# Patient Record
Sex: Female | Born: 1970 | Race: Asian | Hispanic: No | Marital: Married | Smoking: Never smoker
Health system: Southern US, Community
[De-identification: ages and names within clinical notes are randomized; demographics above are authoritative.]

## PROBLEM LIST (undated history)

## (undated) DIAGNOSIS — M1990 Unspecified osteoarthritis, unspecified site: Secondary | ICD-10-CM

## (undated) HISTORY — PX: CYSTECTOMY: SUR359

## (undated) HISTORY — PX: DENTAL SURGERY: SHX609

## (undated) HISTORY — PX: WISDOM TOOTH EXTRACTION: SHX21

## (undated) HISTORY — PX: OOPHORECTOMY: SHX86

## (undated) HISTORY — DX: Unspecified osteoarthritis, unspecified site: M19.90

---

## 1999-11-06 ENCOUNTER — Encounter: Admission: RE | Admit: 1999-11-06 | Discharge: 1999-11-06 | Payer: Self-pay | Admitting: Obstetrics

## 2003-11-14 ENCOUNTER — Emergency Department (HOSPITAL_COMMUNITY): Admission: EM | Admit: 2003-11-14 | Discharge: 2003-11-14 | Payer: Self-pay | Admitting: Emergency Medicine

## 2004-08-17 HISTORY — PX: OOPHORECTOMY: SHX86

## 2010-03-20 ENCOUNTER — Encounter (INDEPENDENT_AMBULATORY_CARE_PROVIDER_SITE_OTHER): Payer: Self-pay | Admitting: *Deleted

## 2010-05-01 ENCOUNTER — Ambulatory Visit: Payer: Self-pay | Admitting: Gastroenterology

## 2010-05-01 ENCOUNTER — Encounter (INDEPENDENT_AMBULATORY_CARE_PROVIDER_SITE_OTHER): Payer: Self-pay | Admitting: *Deleted

## 2010-05-01 DIAGNOSIS — K5900 Constipation, unspecified: Secondary | ICD-10-CM | POA: Insufficient documentation

## 2010-05-01 DIAGNOSIS — K644 Residual hemorrhoidal skin tags: Secondary | ICD-10-CM | POA: Insufficient documentation

## 2010-05-01 LAB — CONVERTED CEMR LAB
ALT: 21 units/L (ref 0–35)
AST: 19 units/L (ref 0–37)
Albumin: 4.4 g/dL (ref 3.5–5.2)
Alkaline Phosphatase: 52 units/L (ref 39–117)
Basophils Absolute: 0 10*3/uL (ref 0.0–0.1)
Bilirubin, Direct: 0.1 mg/dL (ref 0.0–0.3)
CO2: 30 meq/L (ref 19–32)
Chloride: 102 meq/L (ref 96–112)
Eosinophils Relative: 0.2 % (ref 0.0–5.0)
Glucose, Bld: 89 mg/dL (ref 70–99)
HCT: 41.6 % (ref 36.0–46.0)
Lymphocytes Relative: 16.8 % (ref 12.0–46.0)
Lymphs Abs: 1.1 10*3/uL (ref 0.7–4.0)
Monocytes Relative: 4.6 % (ref 3.0–12.0)
Neutrophils Relative %: 77.9 % — ABNORMAL HIGH (ref 43.0–77.0)
Platelets: 171 10*3/uL (ref 150.0–400.0)
Potassium: 3.6 meq/L (ref 3.5–5.1)
Saturation Ratios: 31.3 % (ref 20.0–50.0)
Sed Rate: 5 mm/hr (ref 0–22)
Sodium: 138 meq/L (ref 135–145)
Tissue Transglutaminase Ab, IgA: 6.9 units (ref <20)
Total Protein: 7.1 g/dL (ref 6.0–8.3)
Transferrin: 233.1 mg/dL (ref 212.0–360.0)
WBC: 6.7 10*3/uL (ref 4.5–10.5)

## 2010-05-14 ENCOUNTER — Ambulatory Visit: Payer: Self-pay | Admitting: Gastroenterology

## 2010-09-18 NOTE — Procedures (Signed)
Summary: Colonoscopy  Patient: Stephanie Murphy Note: All result statuses are Final unless otherwise noted.  Tests: (1) Colonoscopy (COL)   COL Colonoscopy           DONE     [facility]     [address]     [place], [place]  [address]           COLONOSCOPY PROCEDURE REPORT           PATIENT:  Stephanie Murphy, Stephanie Murphy  MR#:  [id number]     BIRTHDATE:  08/22/1970, 39 yrs. old  GENDER:  female     ENDOSCOPIST:  Vania Rea. Jarold Motto, MD, [facility]     REF. BY:     PROCEDURE DATE:  05/14/2010     PROCEDURE:  Average-risk screening colonoscopy     G0121     ASA CLASS:  Class I     INDICATIONS:  constipation WEIGHT LOSS.     MEDICATIONS:   Fentanyl 50 mcg IV, Versed 4 mg IV           DESCRIPTION OF PROCEDURE:   After the risks benefits and     alternatives of the procedure were thoroughly explained, informed     consent was obtained.  Digital rectal exam was performed and     revealed no abnormalities.   The LB [id number] endoscope     was introduced through the anus and advanced to the cecum, which     was identified by both the appendix and ileocecal valve, without     limitations.  The quality of the prep was excellent, using     Nulytley.  The instrument was then slowly withdrawn as the colon     was fully examined.     <<PROCEDUREIMAGES>>           FINDINGS:  No polyps or cancers were seen.  This was otherwise a     normal examination of the colon.  External hemorrhoids were found.     Retroflexed views in the rectum revealed no abnormalities.    The     scope was then withdrawn from the patient and the procedure     completed.           COMPLICATIONS:  None     ENDOSCOPIC IMPRESSION:     1) No polyps or cancers     2) Otherwise normal examination     3) External hemorrhoids     CONSTIPATION PREDOMINANT IBS.     RECOMMENDATIONS:     1) High fiber diet with liberal fluid intake.     2) Titrate to need     REPEAT EXAM:  No           ______________________________     Vania Rea.  Jarold Motto, MD, [facility]           CC:           n.     eSIGNED:   Vania Rea. Jahmil Macleod at 05/14/2010 03:59 PM           Shon Hough, [phone]  Note: An exclamation mark (!) indicates a result that was not dispersed into the flowsheet. Document Creation Date: 05/14/2010 4:03 PM _______________________________________________________________________  (1) Order result status: Final Collection or observation date-time: 05/14/2010 15:55 Requested date-time:  Receipt date-time:  Reported date-time:  Referring Physician:   Ordering Physician: Sheryn Bison [phone]) Specimen Source:  Source: Launa Grill Order Number: [id number] Lab site:

## 2010-09-18 NOTE — Letter (Signed)
Summary: [facility] Instructions  [facility]  [address] [place], [place] [address]   Phone: [phone]  Fax: [phone]       CALYNN FERRERO    April 28, 1971    MRN: [id number]        Procedure Day Dorna Bloom: Wednesday 05/14/2010     Arrival Time: 3:00pm     Procedure Time: 4:00pm       Location of Procedure:                    X   [facility]oscopy Center (4th Floor)                       PREPARATION FOR COLONOSCOPY WITH MOVIPREP   Starting 5 days prior to your procedure 05/09/2010 do not eat nuts, seeds, popcorn, corn, beans, peas,  salads, or any raw vegetables.  Do not take any fiber supplements (e.g. Metamucil, Citrucel, and Benefiber).  THE DAY BEFORE YOUR PROCEDURE         DATE: 05/13/2010  DAY: Tuesday  1.  Drink clear liquids the entire day-NO SOLID FOOD  2.  Do not drink anything colored red or purple.  Avoid juices with pulp.  No orange juice.  3.  Drink at least 64 oz. (8 glasses) of fluid/clear liquids during the day to prevent dehydration and help the prep work efficiently.  CLEAR LIQUIDS INCLUDE: Water Jello Ice Popsicles Tea (sugar ok, no milk/cream) Powdered fruit flavored drinks Coffee (sugar ok, no milk/cream) Gatorade Juice: apple, white grape, white cranberry  Lemonade Clear bullion, consomm, broth Carbonated beverages (any kind) Strained chicken noodle soup Hard Candy                             4.  In the morning, mix first dose of MoviPrep solution:    Empty 1 Pouch A and 1 Pouch B into the disposable container    Add lukewarm drinking water to the top line of the container. Mix to dissolve    Refrigerate (mixed solution should be used within 24 hrs)  5.  Begin drinking the prep at 5:00 p.m. The MoviPrep container is divided by 4 marks.   Every 15 minutes drink the solution down to the next mark (approximately 8 oz) until the full liter is complete.   6.  Follow completed prep with 16 oz of clear liquid of your choice (Nothing red  or purple).  Continue to drink clear liquids until bedtime.  7.  Before going to bed, mix second dose of MoviPrep solution:    Empty 1 Pouch A and 1 Pouch B into the disposable container    Add lukewarm drinking water to the top line of the container. Mix to dissolve    Refrigerate  THE DAY OF YOUR PROCEDURE      DATE: 05/13/2010 DAY: Wednesday  Beginning at  11:00am (5 hours before procedure):         1. Every 15 minutes, drink the solution down to the next mark (approx 8 oz) until the full liter is complete.  2. Follow completed prep with 16 oz. of clear liquid of your choice.    3. You may drink clear liquids until 2:00pm (2 HOURS BEFORE PROCEDURE).   MEDICATION INSTRUCTIONS  Unless otherwise instructed, you should take regular prescription medications with a small sip of water   as early as possible the morning of your procedure.  OTHER INSTRUCTIONS  You will need a responsible adult at least 41 years of age to accompany you and drive you home.   This person must remain in the waiting room during your procedure.  Wear loose fitting clothing that is easily removed.  Leave jewelry and other valuables at home.  However, you may wish to bring a book to read or  an iPod/MP3 player to listen to music as you wait for your procedure to start.  Remove all body piercing jewelry and leave at home.  Total time from sign-in until discharge is approximately 2-3 hours.  You should go home directly after your procedure and rest.  You can resume normal activities the  day after your procedure.  The day of your procedure you should not:   Drive   Make legal decisions   Operate machinery   Drink alcohol   Return to work  You will receive specific instructions about eating, activities and medications before you leave.    The above instructions have been reviewed and explained to me by   _______________________    I fully understand and can verbalize these  instructions _____________________________ Date _________

## 2010-09-18 NOTE — Letter (Signed)
Summary: New Patient letter  [facility]  [address] [place], [place] [address]   Phone: [phone]  Fax: [phone]       03/20/2010 MRN: [id number]  Stephanie Murphy [address] [place], [place]  [address]  Dear Stephanie Murphy,  Welcome to the Gastroenterology Division at [facility].    You are scheduled to see Dr.  Jarold Motto on 05-01-10 at 9:00a.m. on the 3rd floor at [facility], [address]  We ask that you try to arrive at our office 15 minutes prior to your appointment time to allow for check-in.  We would like you to complete the enclosed self-administered evaluation form prior to your visit and bring it with you on the day of your appointment.  We will review it with you.  Also, please bring a complete list of all your medications or, if you prefer, bring the medication bottles and we will list them.  Please bring your insurance card so that we may make a copy of it.  If your insurance requires a referral to see a specialist, please bring your referral form from your primary care physician.  Co-payments are due at the time of your visit and may be paid by cash, check or credit card.     Your office visit will consist of a consult with your physician (includes a physical exam), any laboratory testing he/she may order, scheduling of any necessary diagnostic testing (e.g. x-ray, ultrasound, CT-scan), and scheduling of a procedure (e.g. Endoscopy, Colonoscopy) if required.  Please allow enough time on your schedule to allow for any/all of these possibilities.    If you cannot keep your appointment, please call [phone] to cancel or reschedule prior to your appointment date.  This allows [place] the opportunity to schedule an appointment for another patient in need of care.  If you do not cancel or reschedule by 5 p.m. the business day prior to your appointment date, you will be charged a $50.00 late cancellation/no-show fee.    Thank you for choosing  [facility] for your medical needs.  We appreciate the opportunity to care for you.  Please visit [place] at our website  to learn more about our practice.                     Sincerely,                                                             The Gastroenterology Division

## 2010-09-18 NOTE — Assessment & Plan Note (Signed)
Summary: diarrhea--ch.   History of Present Illness Visit Type: Initial Visit Primary GI MD: Sheryn Bison MD FACP FAGA Primary Provider: n/a Chief Complaint: Constipation, patient has urgency multiples times daily, but nothing come out and has a lot of pain; when she does have a bowel movement once daily it is only pellets. History of Present Illness:   40 year old Asian female accompanied by her husband. The patient herself does not speak or understand [place], and her husband has limited ability in speaking [place].  The patient complains of obstipation with gas, bloating, hard bowel movements, tenesmus, and periodic rectal pain over the last year. Apparently she eats poorly has had a progressive weight loss. She denies upper gastrointestinal or hepatobiliary complaints but has had no significant medical evaluation. There is no history of alcohol or cigarette abuse or NSAID abuse.  She specifically denies acid reflux or dysphagia or any history of liver disease or pancreatic problems. Family history is noncontributory. She denies systemic complaints.   GI Review of Systems    Reports bloating and  loss of appetite.      Denies abdominal pain, acid reflux, belching, chest pain, dysphagia with liquids, dysphagia with solids, heartburn, nausea, vomiting, vomiting blood, weight loss, and  weight gain.      Reports rectal pain.     Denies anal fissure, black tarry stools, change in bowel habit, constipation, diarrhea, diverticulosis, fecal incontinence, heme positive stool, hemorrhoids, irritable bowel syndrome, jaundice, light color stool, liver problems, and  rectal bleeding. Preventive Screening-Counseling & Management  Alcohol-Tobacco     Smoking Status: never      Drug Use:  no.      Current Medications (verified): 1)  None  Allergies (verified): No Known Drug Allergies  Past History:  Past medical, surgical, family and social histories (including risk factors) reviewed  for relevance to current acute and chronic problems.  Family History: Reviewed history and no changes required.  Social History: Reviewed history and no changes required. 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  Alcohol Use - no Daily Caffeine Use 1 Illicit Drug Use - no Smoking Status:  never Drug Use:  no  Review of Systems       The patient complains of depression-new, urination - excessive, and voice change.  The patient denies allergy/sinus, anemia, anxiety-new, arthritis/joint pain, back pain, blood in urine, breast changes/lumps, change in vision, confusion, cough, coughing up blood, fainting, fatigue, fever, headaches-new, hearing problems, heart murmur, heart rhythm changes, itching, menstrual pain, muscle pains/cramps, night sweats, nosebleeds, pregnancy symptoms, shortness of breath, skin rash, sleeping problems, sore throat, swelling of feet/legs, swollen lymph glands, thirst - excessive , urination - excessive , urination changes/pain, urine leakage, and vision changes.    Vital Signs:  Patient profile:   40 year old female Height:      62 inches Weight:      105 pounds BMI:     19.27 Pulse rate:   112 / minute Pulse rhythm:   regular BP sitting:   108 / 78  (left arm) Cuff size:   regular  Vitals Entered By: June McMurray CMA Duncan Dull) (May 01, 2010 9:24 AM)  Physical Exam  General:  Well developed, well nourished, no acute distress.healthy appearing.  Somewhat thin appearing but in no acute distress. Head:  Normocephalic and atraumatic. Eyes:  PERRLA, no icterus.exam deferred to patient's ophthalmologist.   Neck:  Supple; no masses or thyromegaly. Lungs:  Clear throughout to auscultation. Heart:  Regular rate and rhythm; no  murmurs, rubs,  or bruits. Abdomen:  Soft, nontender and nondistended. No masses, hepatosplenomegaly or hernias noted. Normal bowel sounds. Rectal:  Normal exam.hemocult negative.  Anoscopic Exam unremarkable except for  some inflamed external hemorrhoidal tissue. I cannot appreciate any definite fissure or fistula. Extremities:  No clubbing, cyanosis, edema or deformities noted. Neurologic:  Alert and  oriented x4;  grossly normal neurologically. Cervical Nodes:  No significant cervical adenopathy. Psych:  Alert and cooperative. Normal mood and affect.   Impression & Recommendations:  Problem # 1:  CONSTIPATION (ICD-564.00) Assessment Deteriorated basic labs ordered along with screening colonoscopy. I placed her on Colace 100 mg twice a day with q.h.s. MiraLax and liberal p.o. fluids. Orders: TLB-CBC Platelet - w/Differential (85025-CBCD) TLB-BMP (Basic Metabolic Panel-BMET) (80048-METABOL) TLB-Hepatic/Liver Function Pnl (80076-HEPATIC) TLB-TSH (Thyroid Stimulating Hormone) (84443-TSH) TLB-B12, Serum-Total ONLY (98119-J47) TLB-Ferritin (82728-FER) TLB-Folic Acid (Folate) (82746-FOL) TLB-IBC Pnl (Iron/FE;Transferrin) (83550-IBC) TLB-Sedimentation Rate (ESR) (85652-ESR) T-Sprue Panel (Celiac Disease Aby Eval) (83516x3/[address]) TLB-IgA (Immunoglobulin A) (82784-IGA) Colonoscopy (Colon)  Problem # 2:  EXTERNAL HEMORRHOIDS (ICD-455.3) Assessment: Unchanged Canasa 1 g suppositories at bedtime with p.r.n. Analpram cream locally. I suspect she does have a small occult fissure in addition to her external hemorrhoids. Orders: TLB-CBC Platelet - w/Differential (85025-CBCD) TLB-BMP (Basic Metabolic Panel-BMET) (80048-METABOL) TLB-Hepatic/Liver Function Pnl (80076-HEPATIC) TLB-TSH (Thyroid Stimulating Hormone) (84443-TSH) TLB-B12, Serum-Total ONLY (82956-O13) TLB-Ferritin (82728-FER) TLB-Folic Acid (Folate) (82746-FOL) TLB-IBC Pnl (Iron/FE;Transferrin) (83550-IBC) TLB-Sedimentation Rate (ESR) (85652-ESR) T-Sprue Panel (Celiac Disease Aby Eval) (83516x3/[address]) TLB-IgA (Immunoglobulin A) (82784-IGA) Colonoscopy (Colon)  Patient Instructions: 1)  [facility] Patient Information Guide  given to patient.  2)  Please go to the basement today for your labs.  3)  Colonoscopy and Flexible Sigmoidoscopy brochure given.  4)  Conscious Sedation brochure given.  5)  Your prescriptions have been sent to you pharmacy, all directions are listed on your med list and the rx.  6)  Your procedure has been scheduled for 05/14/2010, please follow the seperate instructions.  7)  The medication list was reviewed and reconciled.  All changed / newly prescribed medications were explained.  A complete medication list was provided to the patient / caregiver. Prescriptions: MOVIPREP 100 GM  SOLR (PEG-KCL-NACL-NASULF-NA ASC-C) As per prep instructions.  #1 x 0   Entered by:   Harlow Mares CMA (AAMA)   Authorized by:   Mardella Layman MD [facility]   Signed by:   Harlow Mares CMA (AAMA) on 05/01/2010   Method used:   Electronically to        [facility] [address] (retail)       [address]       [place], [place]  [address]       Ph: [phone]       Fax: [phone]   RxID:   [id number] CANASA 1000 MG SUPP (MESALAMINE) remove foil and insert one supp into rectum at bedtime  #20 x 0   Entered by:   Harlow Mares CMA (AAMA)   Authorized by:   Mardella Layman MD [facility]   Signed by:   Harlow Mares CMA (AAMA) on 05/01/2010   Method used:   Electronically to        [facility] (retail)       [address]       [place], [place]  [address]       Ph: [phone]       Fax: [phone]   RxID:   [id number] MIRALAX  POWD (POLYETHYLENE GLYCOL  3350) mix one capfull in 8 ounce of water and drink at bedtime  #527 x 2   Entered by:   Harlow Mares CMA (AAMA)   Authorized by:   Mardella Layman MD [facility]   Signed by:   Harlow Mares CMA (AAMA) on 05/01/2010   Method used:   Electronically to        [facility] (retail)       [address]       [place], [place]  [address]       Ph: [phone]       Fax: [phone]    RxID:   [id number] COLACE 100 MG CAPS (DOCUSATE SODIUM) take one by mouth two times a day  #60 x 2   Entered by:   Harlow Mares CMA (AAMA)   Authorized by:   Mardella Layman MD [facility]   Signed by:   Harlow Mares CMA (AAMA) on 05/01/2010   Method used:   Electronically to        [facility] (retail)       [address]       [place], [place]  [id number]       Ph: [phone]       Fax: [phone]   RxID:   [id number] ANALPRAM-HC 1-2.5 % CREA (HYDROCORTISONE ACE-PRAMOXINE) use per retum two times a day  #1 tube x 1   Entered by:   Harlow Mares CMA (AAMA)   Authorized by:   Mardella Layman MD [facility]   Signed by:   Harlow Mares CMA (AAMA) on 05/01/2010   Method used:   Electronically to        [facility] (retail)       [address]       [place], [place]  [id number]       Ph: [phone]       Fax: [phone]   RxID:   [id number]

## 2013-09-14 ENCOUNTER — Other Ambulatory Visit: Payer: Self-pay | Admitting: Family Medicine

## 2013-09-14 DIAGNOSIS — B1910 Unspecified viral hepatitis B without hepatic coma: Secondary | ICD-10-CM

## 2013-09-14 DIAGNOSIS — A0472 Enterocolitis due to Clostridium difficile, not specified as recurrent: Secondary | ICD-10-CM

## 2013-09-14 DIAGNOSIS — R140 Abdominal distension (gaseous): Secondary | ICD-10-CM

## 2013-09-21 ENCOUNTER — Ambulatory Visit
Admission: RE | Admit: 2013-09-21 | Discharge: 2013-09-21 | Disposition: A | Discharge status: Home / Self Care | Payer: BC Managed Care – PPO | Source: Ambulatory Visit | Attending: Family Medicine | Admitting: Family Medicine

## 2013-09-21 DIAGNOSIS — A0472 Enterocolitis due to Clostridium difficile, not specified as recurrent: Secondary | ICD-10-CM

## 2013-09-21 DIAGNOSIS — R140 Abdominal distension (gaseous): Secondary | ICD-10-CM

## 2013-09-21 DIAGNOSIS — B1910 Unspecified viral hepatitis B without hepatic coma: Secondary | ICD-10-CM

## 2014-08-17 HISTORY — PX: COLONOSCOPY: SHX174

## 2014-08-17 HISTORY — PX: POLYPECTOMY: SHX149

## 2014-10-08 ENCOUNTER — Other Ambulatory Visit: Payer: Self-pay | Admitting: Physician Assistant

## 2014-10-08 DIAGNOSIS — B1910 Unspecified viral hepatitis B without hepatic coma: Secondary | ICD-10-CM

## 2014-10-08 DIAGNOSIS — R1084 Generalized abdominal pain: Secondary | ICD-10-CM

## 2014-10-08 DIAGNOSIS — R140 Abdominal distension (gaseous): Secondary | ICD-10-CM

## 2014-10-15 ENCOUNTER — Inpatient Hospital Stay: Admission: RE | Admit: 2014-10-15 | Payer: Self-pay | Source: Ambulatory Visit

## 2014-12-04 ENCOUNTER — Other Ambulatory Visit: Payer: Self-pay | Admitting: Obstetrics & Gynecology

## 2014-12-04 DIAGNOSIS — R928 Other abnormal and inconclusive findings on diagnostic imaging of breast: Secondary | ICD-10-CM

## 2014-12-10 ENCOUNTER — Ambulatory Visit
Admission: RE | Admit: 2014-12-10 | Discharge: 2014-12-10 | Disposition: A | Discharge status: Home / Self Care | Payer: BLUE CROSS/BLUE SHIELD | Source: Ambulatory Visit | Attending: Obstetrics & Gynecology | Admitting: Obstetrics & Gynecology

## 2014-12-10 DIAGNOSIS — R928 Other abnormal and inconclusive findings on diagnostic imaging of breast: Secondary | ICD-10-CM

## 2015-02-21 ENCOUNTER — Encounter: Payer: Self-pay | Admitting: Gastroenterology

## 2016-07-16 ENCOUNTER — Other Ambulatory Visit: Payer: Self-pay | Admitting: Internal Medicine

## 2016-07-16 DIAGNOSIS — Z1231 Encounter for screening mammogram for malignant neoplasm of breast: Secondary | ICD-10-CM

## 2016-08-18 ENCOUNTER — Ambulatory Visit: Payer: BLUE CROSS/BLUE SHIELD

## 2018-03-02 ENCOUNTER — Other Ambulatory Visit: Payer: Self-pay | Admitting: Internal Medicine

## 2018-03-02 DIAGNOSIS — Z1231 Encounter for screening mammogram for malignant neoplasm of breast: Secondary | ICD-10-CM

## 2018-03-23 ENCOUNTER — Ambulatory Visit: Payer: BLUE CROSS/BLUE SHIELD

## 2018-05-09 ENCOUNTER — Ambulatory Visit: Payer: BLUE CROSS/BLUE SHIELD

## 2018-06-28 ENCOUNTER — Ambulatory Visit
Admission: RE | Admit: 2018-06-28 | Discharge: 2018-06-28 | Disposition: A | Discharge status: Home / Self Care | Payer: BLUE CROSS/BLUE SHIELD | Source: Ambulatory Visit | Attending: Internal Medicine | Admitting: Internal Medicine

## 2018-06-28 DIAGNOSIS — Z1231 Encounter for screening mammogram for malignant neoplasm of breast: Secondary | ICD-10-CM

## 2019-09-14 IMAGING — MG DIGITAL SCREENING BILATERAL MAMMOGRAM WITH CAD
4 series · 4 of 4 positions shown · non-contrast
Comparison: Previous exam(s).

CLINICAL DATA: Screening.

EXAM:
DIGITAL SCREENING BILATERAL MAMMOGRAM WITH CAD

[R CC]
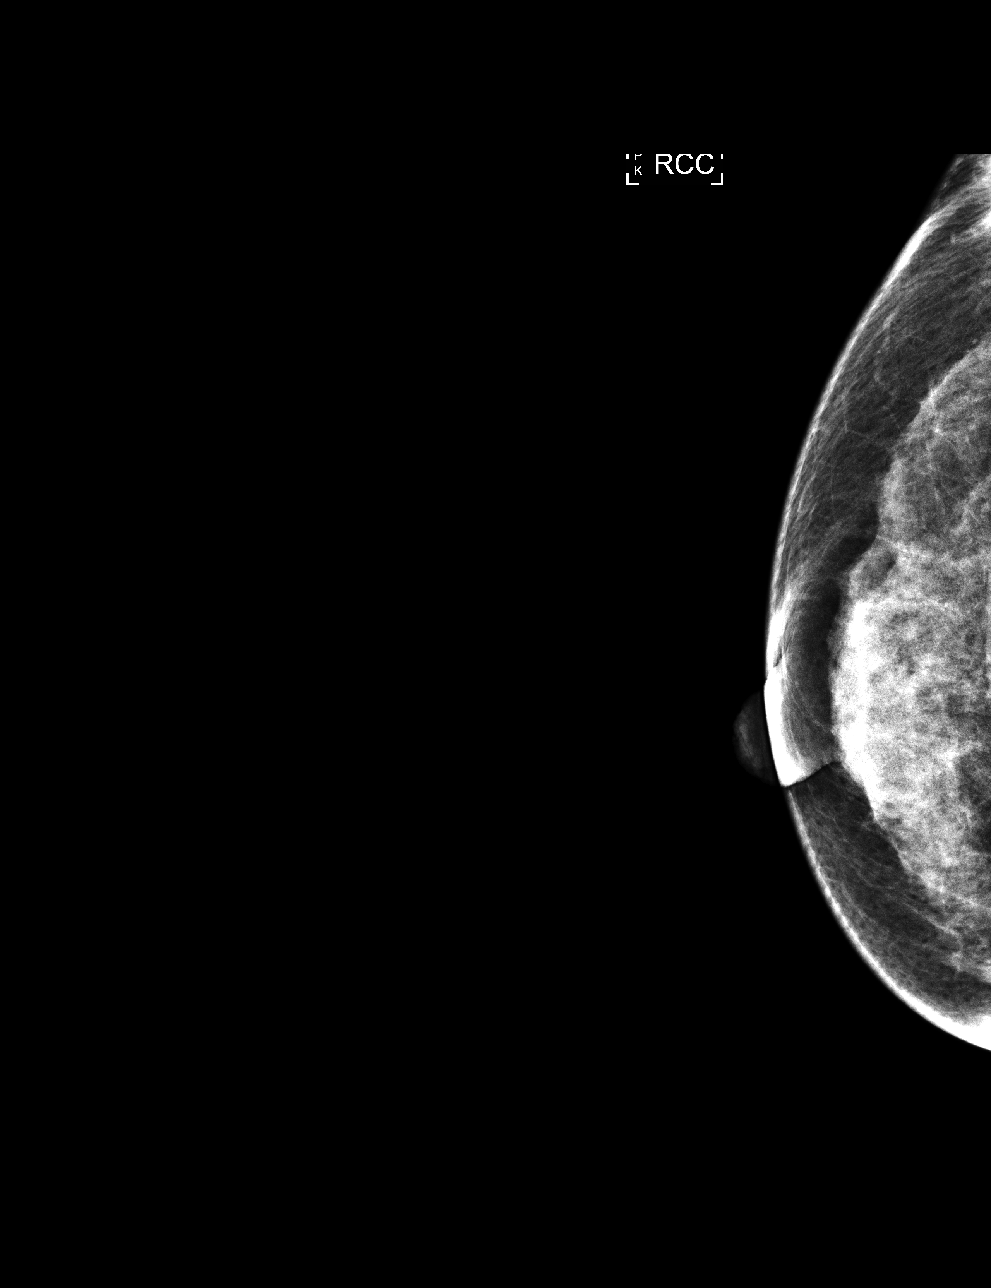

[L MLO]
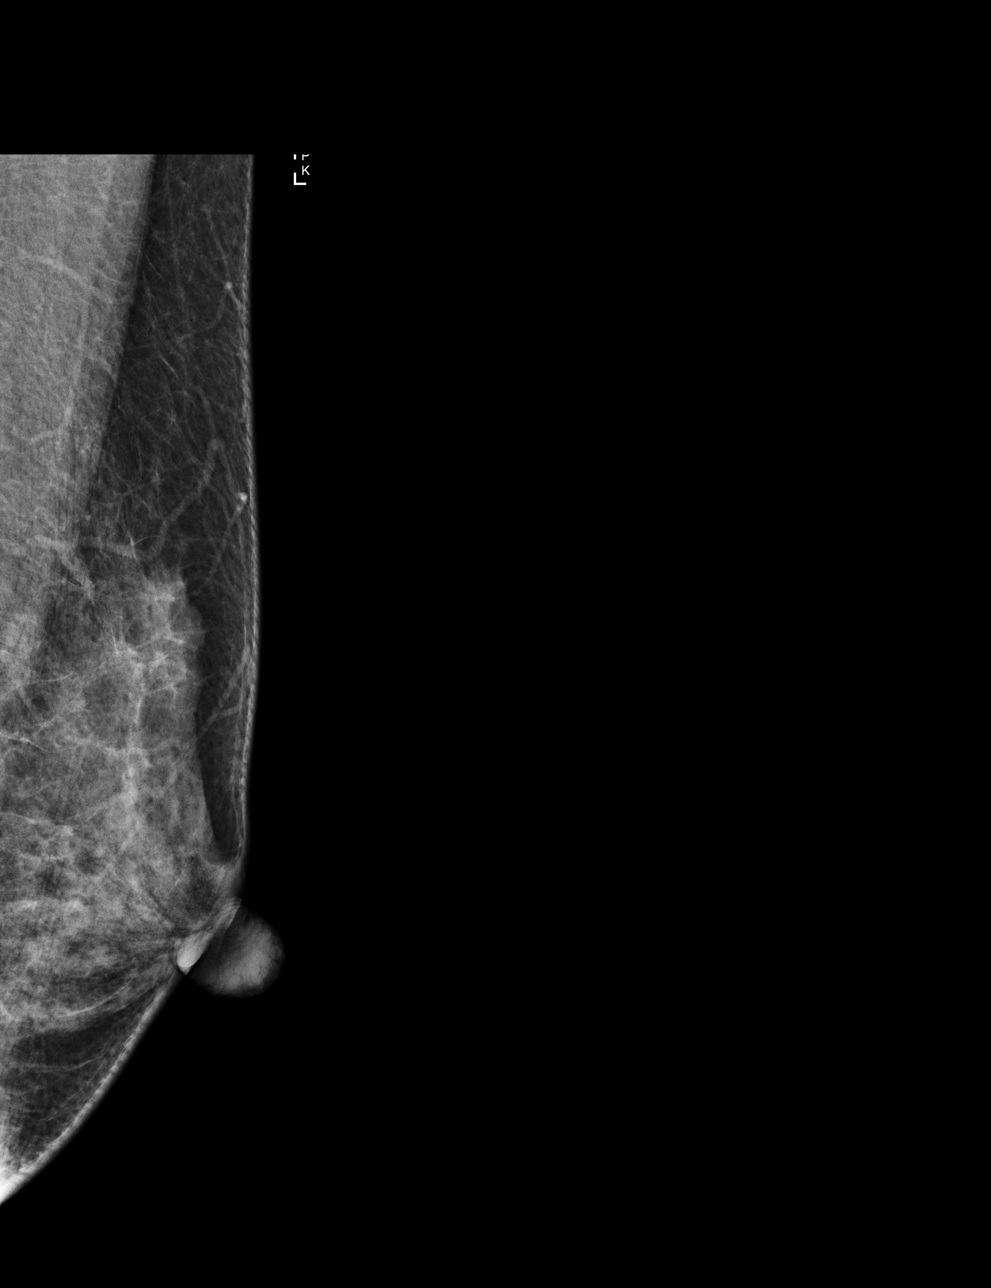

[R MLO]
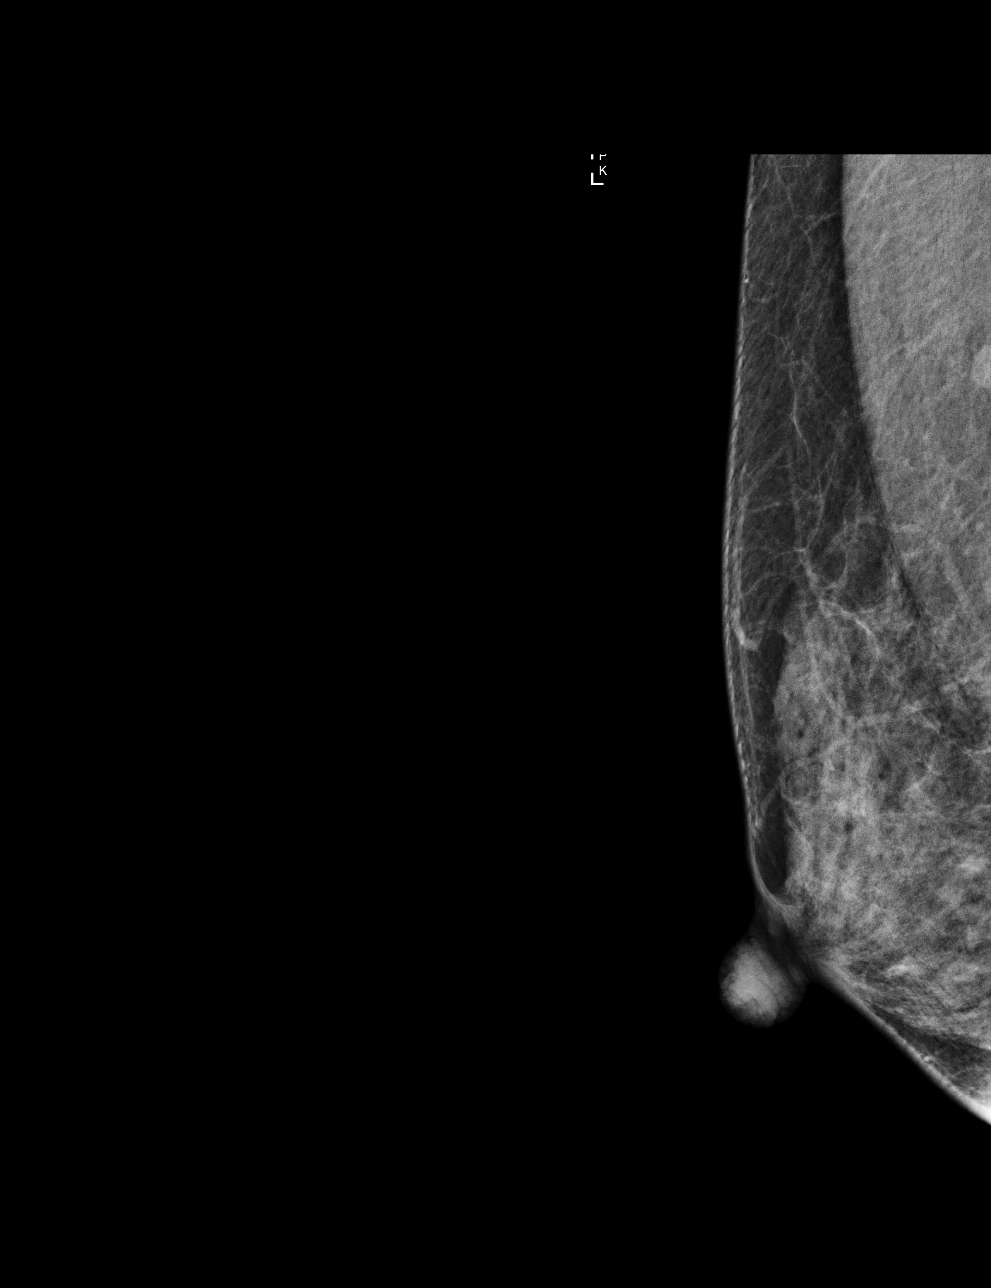

[L CC]
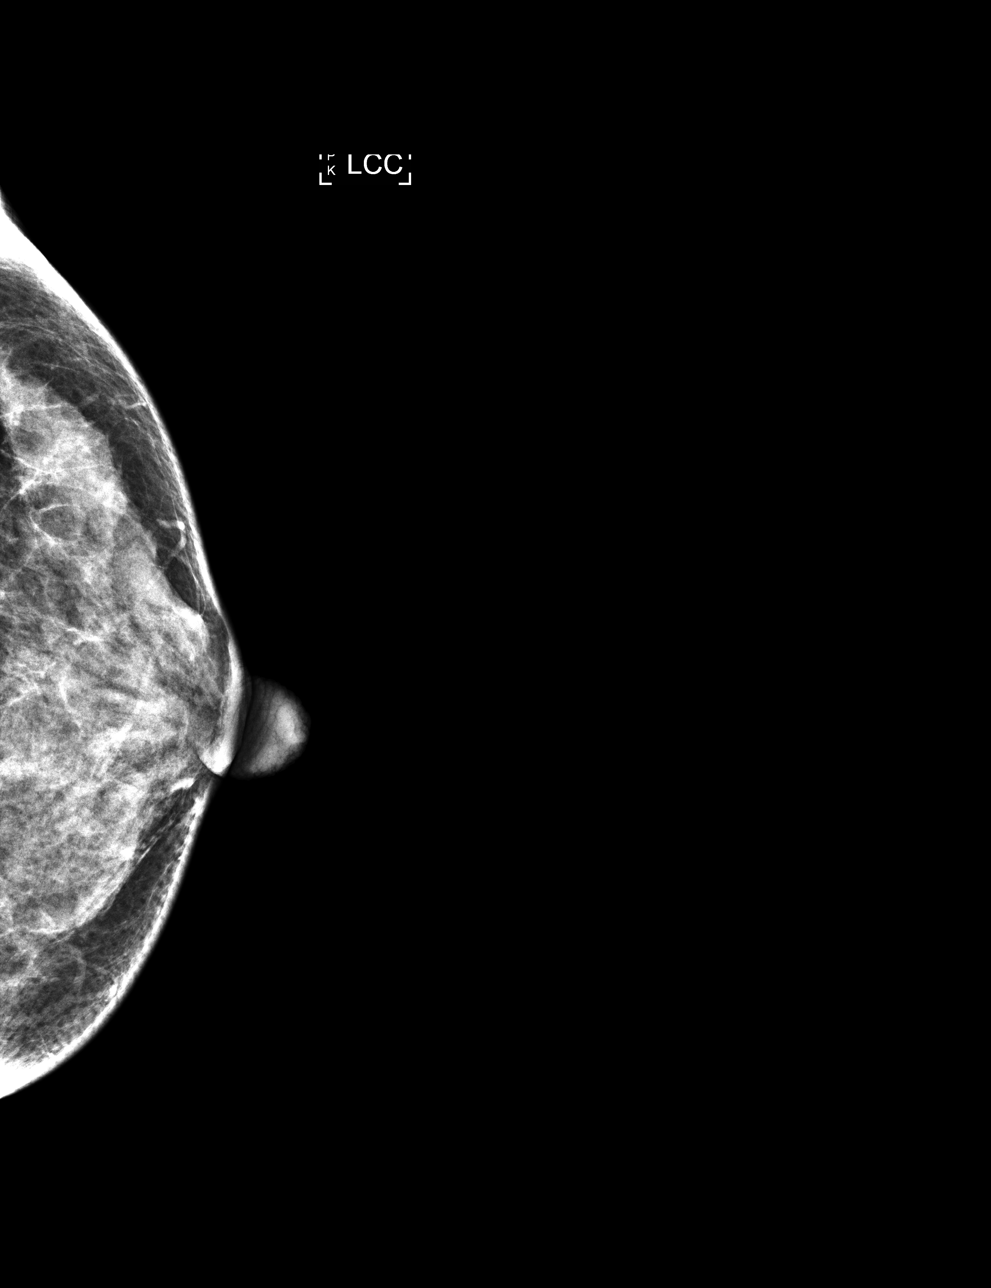

[4 of 4 positions shown; findings below may reference images not displayed]

ACR Breast Density Category d: The breast tissue is extremely dense,
which lowers the sensitivity of mammography.
FINDINGS: There are no findings suspicious for malignancy. Images were
processed with CAD.
IMPRESSION: No mammographic evidence of malignancy. A result letter of this
screening mammogram will be mailed directly to the patient.

RECOMMENDATION:
Screening mammogram in one year. (Code:BD-D-K0F)

BI-RADS CATEGORY  1: Negative.

## 2019-11-30 ENCOUNTER — Encounter: Payer: Self-pay | Admitting: Family Medicine

## 2019-12-08 ENCOUNTER — Encounter: Payer: Self-pay | Admitting: Gastroenterology

## 2019-12-18 ENCOUNTER — Encounter: Payer: Self-pay | Admitting: Gastroenterology

## 2020-01-18 ENCOUNTER — Encounter: Payer: Self-pay | Admitting: Gastroenterology

## 2020-01-18 ENCOUNTER — Ambulatory Visit: Payer: BLUE CROSS/BLUE SHIELD | Admitting: Gastroenterology

## 2020-01-18 ENCOUNTER — Telehealth: Payer: Self-pay | Admitting: Gastroenterology

## 2020-01-18 ENCOUNTER — Ambulatory Visit (INDEPENDENT_AMBULATORY_CARE_PROVIDER_SITE_OTHER): Payer: 59 | Admitting: Gastroenterology

## 2020-01-18 VITALS — BP 102/64 | HR 78 | Ht 62.0 in | Wt 112.4 lb

## 2020-01-18 DIAGNOSIS — R1013 Epigastric pain: Secondary | ICD-10-CM | POA: Diagnosis not present

## 2020-01-18 DIAGNOSIS — E739 Lactose intolerance, unspecified: Secondary | ICD-10-CM | POA: Diagnosis not present

## 2020-01-18 DIAGNOSIS — R198 Other specified symptoms and signs involving the digestive system and abdomen: Secondary | ICD-10-CM

## 2020-01-18 DIAGNOSIS — R140 Abdominal distension (gaseous): Secondary | ICD-10-CM | POA: Diagnosis not present

## 2020-01-18 NOTE — Progress Notes (Addendum)
[facility] Consult Note:  History: Stephanie Murphy 01/18/2020  Referring provider: Clovis Riley, Elbert Ewings.August Saucer, MD  Reason for consult/chief complaint: New Patient (Initial Visit), Bloated, Diarrhea (not all the time just when she intakes certain foods, noticed it with milk ), belching, and Abdominal Pain (with cramping when she is trying to have a BM )   Subjective  HPI:  This is a pleasant 49 year old [place] woman seen with the aid of a Mandarin interpreter. Even with the interpreter it was somewhat difficult to get a clear and consistent history.  The initial story was of about a year of frequent upper abdominal postprandial pain with bloating.  She would sometimes also wake in the middle of the night with a feeling of urgency for a BM, but then might sit on the toilet for at least 20 minutes and nothing happens.  Her typical bowel pattern is 1-2 formed stools per day, though may be more often if she eats more.  She had a telemedicine appointment with primary care sometime last year during the pandemic, reportedly tried some different medicines but they were no help.  She tried some [place] herbs that also did not seem to help much. She often feels bloated and gassy, especially after milk or dairy products.  She has frequent belching, no nausea or vomiting.  Appetite is variable and weight stable. The story then evolved where she recalls similar symptoms several years ago and was referred by primary care to another GI here in town (?  [facility]).  She recalls perhaps some medicines were tried, she did not have any procedures.  She then went to [name] and had what sounds like an upper endoscopy and colonoscopy there.  She only recalls there being polyps in the stomach.  Symptoms seem to decrease for a while afterwards, she cannot recall if she got any particular medicines from them.  Then symptoms seem to flareup again about a year ago.  When I reminded her that she had had a colonoscopy here  10 years ago, she was uncertain whether the current symptoms were reminiscent of what was occurring then. She apparently saw primary care for a physical in March of this year, had some labs but no imaging that she recalls.  The symptoms were continuing and primary care suggested she see [place].  Unfortunately no primary care records are available at today's visit.   Colonoscopy Jarold Motto) 05/14/2010 for constipation and weight loss - normal.  ROS:  Review of Systems  Constitutional: Negative for appetite change and unexpected weight change.  HENT: Negative for mouth sores and voice change.   Eyes: Negative for pain and redness.  Respiratory: Negative for cough and shortness of breath.   Cardiovascular: Negative for chest pain and palpitations.  Gastrointestinal:       No rectal bleeding  Genitourinary: Negative for dysuria and hematuria.  Musculoskeletal: Negative for arthralgias and myalgias.  Skin: Negative for pallor and rash.  Neurological: Negative for weakness and headaches.  Hematological: Negative for adenopathy.     Past Medical History: History reviewed. No pertinent past medical history.  No chronic medical problems Past Surgical History: Past Surgical History:  Procedure Laterality Date  . CYSTECTOMY    . OOPHORECTOMY    The surgeries were 3 to 4 years ago, based on patient's best recollection   Family History: Family History  Problem Relation Age of Onset  . Breast cancer Sister     Social History: Social History   Socioeconomic History  .  Marital status: Married    Spouse name: Not on file  . Number of children: 2  . Years of education: Not on file  . Highest education level: Not on file  Occupational History  . Occupation: Conservation officer, nature  Tobacco Use  . Smoking status: Never Smoker  . Smokeless tobacco: Never Used  Substance and Sexual Activity  . Alcohol use: Never  . Drug use: Never  . Sexual activity: Not on file  Other Topics Concern  . Not on file   Social History Narrative  . Not on file   Social Determinants of Health   Financial Resource Strain:   . Difficulty of Paying Living Expenses:   Food Insecurity:   . Worried About Programme researcher, broadcasting/film/video in the Last Year:   . Barista in the Last Year:   Transportation Needs:   . Freight forwarder (Medical):   Marland Kitchen Lack of Transportation (Non-Medical):   Physical Activity:   . Days of Exercise per Week:   . Minutes of Exercise per Session:   Stress:   . Feeling of Stress :   Social Connections:   . Frequency of Communication with Friends and Family:   . Frequency of Social Gatherings with Friends and Family:   . Attends Religious Services:   . Active Member of Clubs or Organizations:   . Attends Banker Meetings:   Marland Kitchen Marital Status:     Allergies: No Known Allergies  Outpatient Meds: Current Outpatient Medications  Medication Sig Dispense Refill  . Multiple Vitamins-Minerals (WOMENS MULTIVITAMIN PO) Take by mouth.    Marland Kitchen VITAMIN D PO Take by mouth.     No current facility-administered medications for this visit.      ___________________________________________________________________ Objective   Exam:  BP 102/64   Pulse 78   Ht 5\' 2"  (1.575 m)   Wt 112 lb 6.4 oz (51 kg)   SpO2 98%   BMI 20.56 kg/m    General: She is well-appearing, pleasant and conversational.  Female mandrin interpreter present for entire visit.  Eyes: sclera anicteric, no redness  ENT: oral mucosa moist without lesions, no cervical or supraclavicular lymphadenopathy  CV: RRR without murmur, S1/S2, no JVD, no peripheral edema  Resp: clear to auscultation bilaterally, normal RR and effort noted  GI: soft, no tenderness, with active bowel sounds. No guarding or palpable organomegaly noted.  Skin; warm and dry, no rash or jaundice noted  Neuro: awake, alert and oriented x 3. Normal gross motor function and fluent speech  Labs:  No data for  review  Assessment: Encounter Diagnoses  Name Primary?  . Epigastric pain Yes  . Abdominal bloating   . Lactose intolerance   . Tenesmus    Constellation of GI symptoms somewhat difficult to characterize.  It seems they have been present intermittently for least several years and then more prominent within the last year.  Results of any prior testing are presently unknown.  Unknown if previously tested and/or treated for H. pylori.  If so, recurrent symptoms raise suspicion for persistent/resistant H pylori and/or development of gastritis or malignancy.  No red flag symptoms such as vomiting, anorexia or weight loss however.   At some point in the past bowel habits were altered, at least when she was here 10 years ago.  Currently those are normal and she denies rectal bleeding.  Bloating and gas that sounds at least partially from lactose intolerance.   Plan:  Upper endoscopy.  If unrevealing, may  need CT scan to follow Request records from primary care and [facility] (in case that is where she went)  Written dietary advice regarding bloating gas and lactose intolerance  Thank you for the courtesy of this consult.  Please call me with any questions or concerns. (Extra time required for interpretive services) Nelida Meuse III  CC: Referring provider noted above   Record review addendum:  See EGD report from 02/09/20 Records available day of EGD included report from colonoscopy by Dr. Meridee [place] - conplete colonoscopy with good prep for abdominal pain.  66mm TA removed, o/w normal exam.  May 2023 recall will be placed, based on curent guidelines.  PCP note also indicates Hx IBS   - Wilfrid Lund, MD    [facility]

## 2020-01-18 NOTE — Telephone Encounter (Signed)
Hi Dr. Myrtie Neither,  This patient's daughter just called and she would like to know if the patient can be scheduled to have a double (EGD\Colon).  Please advise on scheduling thank you

## 2020-01-18 NOTE — Telephone Encounter (Signed)
Ok great!   Thank you will advise.

## 2020-01-18 NOTE — Patient Instructions (Signed)
If you are age 50 or older, your body mass index should be between 23-30. Your Body mass index is 20.56 kg/m. If this is out of the aforementioned range listed, please consider follow up with your Primary Care Provider.  If you are age 45 or younger, your body mass index should be between 19-25. Your Body mass index is 20.56 kg/m. If this is out of the aformentioned range listed, please consider follow up with your Primary Care Provider.   You have been scheduled for an endoscopy. Please follow written instructions given to you at your visit today. If you use inhalers (even only as needed), please bring them with you on the day of your procedure.  It was a pleasure to see you today!  Dr. Myrtie Neither

## 2020-01-18 NOTE — Telephone Encounter (Signed)
Patient's daughter states she is due for a 10 yr recall. Had colonoscopy done by [facility] 05/14/2010 report is in Epic. Are you needing additional records?   Thank you

## 2020-01-18 NOTE — Telephone Encounter (Signed)
That was addressed with the patient during the visit. Reported symptoms appear to be upper digestive in nature, so EGD planned.  She also reportedly had a prior colonoscopy for these symptoms in the past. Awaiting prior primary care and GI records to help understand clinical picture.

## 2020-01-19 NOTE — Telephone Encounter (Signed)
Called patient's daughter to advise her of Dr. Myrtie Neither recommendations left voicemail.

## 2020-01-19 NOTE — Telephone Encounter (Signed)
This patient told me she had a colonoscopy in [name] several years ago.  Please assist the patient and family with access to MyChart so they can access her record and my not if they wish.  I will discuss it further with the patient and family the day of upcoming procedure.

## 2020-01-30 ENCOUNTER — Encounter: Payer: BLUE CROSS/BLUE SHIELD | Admitting: Gastroenterology

## 2020-01-31 ENCOUNTER — Encounter: Payer: 59 | Admitting: Gastroenterology

## 2020-01-31 ENCOUNTER — Encounter: Payer: Self-pay | Admitting: Gastroenterology

## 2020-02-09 ENCOUNTER — Other Ambulatory Visit: Payer: Self-pay

## 2020-02-09 ENCOUNTER — Ambulatory Visit (INDEPENDENT_AMBULATORY_CARE_PROVIDER_SITE_OTHER): Payer: 59 | Admitting: Gastroenterology

## 2020-02-09 ENCOUNTER — Encounter: Payer: Self-pay | Admitting: Gastroenterology

## 2020-02-09 ENCOUNTER — Telehealth: Payer: Self-pay

## 2020-02-09 VITALS — BP 104/66 | HR 57 | Temp 97.3°F | Resp 14 | Ht 62.0 in | Wt 112.0 lb

## 2020-02-09 DIAGNOSIS — G8929 Other chronic pain: Secondary | ICD-10-CM

## 2020-02-09 DIAGNOSIS — R1013 Epigastric pain: Secondary | ICD-10-CM

## 2020-02-09 MED ORDER — SODIUM CHLORIDE 0.9 % IV SOLN: 500.0000 mL | INTRAVENOUS | Status: DC

## 2020-02-09 NOTE — Progress Notes (Signed)
pt tolerated well. VSS. awake and to recovery. Report given to RN. Bite block placed and removed without trauma. 

## 2020-02-09 NOTE — Progress Notes (Signed)
Called to room to assist during endoscopic procedure.  Patient ID and intended procedure confirmed with present staff. Received instructions for my participation in the procedure from the performing physician.  

## 2020-02-09 NOTE — Patient Instructions (Signed)
Follow a Lactose - Free diet, no dairy, no milk or milk products.  YOU HAD AN ENDOSCOPIC PROCEDURE TODAY AT THE [facility]:   Refer to the procedure report that was given to you for any specific questions about what was found during the examination.  If the procedure report does not answer your questions, please call your gastroenterologist to clarify.  If you requested that your care partner not be given the details of your procedure findings, then the procedure report has been included in a sealed envelope for you to review at your convenience later.  YOU SHOULD EXPECT: Some feelings of bloating in the abdomen. Passage of more gas than usual.  Walking can help get rid of the air that was put into your GI tract during the procedure and reduce the bloating. If you had a lower endoscopy (such as a colonoscopy or flexible sigmoidoscopy) you may notice spotting of blood in your stool or on the toilet paper. If you underwent a bowel prep for your procedure, you may not have a normal bowel movement for a few days.  Please Note:  You might notice some irritation and congestion in your nose or some drainage.  This is from the oxygen used during your procedure.  There is no need for concern and it should clear up in a day or so.  SYMPTOMS TO REPORT IMMEDIATELY:   Following upper endoscopy (EGD)  Vomiting of blood or coffee ground material  New chest pain or pain under the shoulder blades  Painful or persistently difficult swallowing  New shortness of breath  Fever of 100F or higher  Black, tarry-looking stools  For urgent or emergent issues, a gastroenterologist can be reached at any hour by calling [phone]. Do not use MyChart messaging for urgent concerns.    DIET:  We do recommend a small meal at first, but then you may proceed to your regular diet.  Drink plenty of fluids but you should avoid alcoholic beverages for 24 hours.  ACTIVITY:  You should plan to take it easy for  the rest of today and you should NOT DRIVE or use heavy machinery until tomorrow (because of the sedation medicines used during the test).    FOLLOW UP: Our staff will call the number listed on your records 48-72 hours following your procedure to check on you and address any questions or concerns that you may have regarding the information given to you following your procedure. If we do not reach you, we will leave a message.  We will attempt to reach you two times.  During this call, we will ask if you have developed any symptoms of COVID 19. If you develop any symptoms (ie: fever, flu-like symptoms, shortness of breath, cough etc.) before then, please call [phone].  If you test positive for Covid 19 in the 2 weeks post procedure, please call and report this information to [place].    If any biopsies were taken you will be contacted by phone or by letter within the next 1-3 weeks.  Please call us at [phone] if you have not heard about the biopsies in 3 weeks.    SIGNATURES/CONFIDENTIALITY: You and/or your care partner have signed paperwork which will be entered into your electronic medical record.  These signatures attest to the fact that that the information above on your After Visit Summary has been reviewed and is understood.  Full responsibility of the confidentiality of this discharge information lies with you and/or your care-partner.

## 2020-02-09 NOTE — Telephone Encounter (Signed)
Pt son requests his number be called or his sister's number Stephanie Murphy) since pt does not speak english.

## 2020-02-09 NOTE — Op Note (Signed)
[facility] Patient Name: Stephanie Murphy Procedure Date: 02/09/2020 1:59 PM MRN: [id number] Endoscopist: Sherilyn Cooter L. Myrtie Neither , MD Age: 49 Referring MD:  Date of Birth: [birth year] Gender: Female Account #: [id number] Procedure:                Upper GI endoscopy Indications:              Epigastric abdominal pain, Previously treated for                            Helicobacter pylori, Abdominal bloating - details                            recent office consult note , further records as                            noted below Medicines:                Monitored Anesthesia Care Procedure:                Pre-Anesthesia Assessment:                           - Prior to the procedure, a History and Physical                            was performed, and patient medications and                            allergies were reviewed. The patient's tolerance of                            previous anesthesia was also reviewed. The risks                            and benefits of the procedure and the sedation                            options and risks were discussed with the patient.                            All questions were answered, and informed consent                            was obtained. Prior Anticoagulants: The patient has                            taken no previous anticoagulant or antiplatelet                            agents. ASA Grade Assessment: I - A normal, healthy                            patient. After reviewing the risks and benefits,  the patient was deemed in satisfactory condition to                            undergo the procedure.                           After obtaining informed consent, the endoscope was                            passed under direct vision. Throughout the                            procedure, the patient's blood pressure, pulse, and                            oxygen saturations were monitored continuously. The                             Endoscope was introduced through the mouth, and                            advanced to the second part of duodenum. The upper                            GI endoscopy was accomplished without difficulty.                            The patient tolerated the procedure well. Scope In: Scope Out: Findings:                 The esophagus was normal.                           The entire examined stomach was normal. Biopsies                            were taken with a cold forceps for Helicobacter                            pylori testing using CLOtest.                           The cardia and gastric fundus were normal on                            retroflexion.                           The examined duodenum was normal. Complications:            No immediate complications. Estimated Blood Loss:     Estimated blood loss was minimal. Impression:               - Normal esophagus.                           - Normal stomach. Biopsied.                           -  Normal examined duodenum.                           Symptoms suggest lactose intolerance and possible                            IBS. Recommendation:           - Patient has a contact number available for                            emergencies. The signs and symptoms of potential                            delayed complications were discussed with the                            patient. Return to normal activities tomorrow.                            Written discharge instructions were provided to the                            patient.                           Avoid lactose                           - Continue present medications.                           - Await pathology results.                           - Lactose free diet. (at least 2 week trial to see                            if improvement in bloating)                           - Records available day of procedure (which had not                            been  available day of office visit) include report                            of colonoscopy by Dr. Meridee Score [place]                            from 12/[place] done for abdominal pain. Complete exam,                            good prep, 44mm tubular adenoma removed. Based on  this, colonoscopy recall May 2023 recommended (7                            year interval, based on current surveillance                            guidelines) Stephanie Murphy L. Myrtie Neither, MD 02/09/2020 2:26:11 PM This report has been signed electronically.

## 2020-02-12 LAB — HELICOBACTER PYLORI SCREEN-BIOPSY: UREASE: NEGATIVE

## 2020-02-13 ENCOUNTER — Telehealth: Payer: Self-pay

## 2020-02-13 ENCOUNTER — Encounter: Payer: Self-pay | Admitting: Gastroenterology

## 2020-02-13 NOTE — Telephone Encounter (Signed)
  Follow up Call-  Call back number 02/09/2020  Post procedure Call Back phone  # [phone]-son Stephanie Murphy speaks english  Permission to leave phone message Yes  Some recent data might be hidden     Patient questions:  Do you have a fever, pain , or abdominal swelling? No. Pain Score  0 *  Have you tolerated food without any problems? Yes.    Have you been able to return to your normal activities? Yes.    Do you have any questions about your discharge instructions: Diet   No. Medications  No. Follow up visit  No.  Do you have questions or concerns about your Care? No.  Actions: * If pain score is 4 or above: 1. No action needed, pain <4.Have you developed a fever since your procedure? no  2.   Have you had an respiratory symptoms (SOB or cough) since your procedure? no  3.   Have you tested positive for COVID 19 since your procedure no  4.   Have you had any family members/close contacts diagnosed with the COVID 19 since your procedure?  no   If yes to any of these questions please route to Laverna Peace, RN and Charlett Lango, RN

## 2020-02-13 NOTE — Telephone Encounter (Signed)
No answer, left message to call back later today, B.Nikolus Marczak RN. 

## 2020-03-26 ENCOUNTER — Telehealth: Payer: Self-pay | Admitting: Gastroenterology

## 2020-03-26 NOTE — Telephone Encounter (Signed)
Rec'd from [facility] forwarded 15 pages to Dr. Amada Jupiter

## 2022-02-19 ENCOUNTER — Ambulatory Visit: Payer: Self-pay | Admitting: Family

## 2022-02-24 ENCOUNTER — Ambulatory Visit: Payer: Self-pay | Admitting: Family

## 2022-02-26 ENCOUNTER — Ambulatory Visit (INDEPENDENT_AMBULATORY_CARE_PROVIDER_SITE_OTHER): Payer: 59 | Admitting: Family

## 2022-02-26 ENCOUNTER — Other Ambulatory Visit: Payer: Self-pay

## 2022-02-26 ENCOUNTER — Encounter: Payer: Self-pay | Admitting: Family

## 2022-02-26 VITALS — BP 102/63 | HR 67 | Temp 98.0°F | Wt 115.0 lb

## 2022-02-26 DIAGNOSIS — B181 Chronic viral hepatitis B without delta-agent: Secondary | ICD-10-CM | POA: Insufficient documentation

## 2022-02-26 NOTE — Patient Instructions (Signed)
Nice to meet you.  We will check your lab work and get you get scheduled for an ultrasound.  No treatment is needed today.  Plan for follow up in 6 months or sooner if needed.   Have a great day and stay safe!

## 2022-02-26 NOTE — Progress Notes (Signed)
Subjective:    Patient ID: Stephanie Murphy, female    DOB: 1971-01-12, 51 y.o.   MRN: [id number]  Chief Complaint  Patient presents with   Hepatitis B    HPI:  Stephanie Murphy is a 51 y.o. female with previous medical history of IBS, H. Pylori, COVID, and Hepatitis B presenting today for evaluation of Hepatitis B. Stephanie Murphy primary preferred language is [place] and a medical interpreter is present to aid in communication.   Stephanie Murphy was born in [name] emigrating to the [name] about [place] years ago. Informed of Hepatitis B status when she was a teenager. Has not received prior treatment and has not had any flares. No current symptoms and denies abdominal pain, nausea, vomiting, diarrhea, fatigue, scleral icterus or jaundice. No personal or family history of liver disease.  Recently seen by Internal Medicine with lab work showing positive Hepatitis B surface antibody and Hepatitis B core Ab. Hepatitis B surface antibody and Hepatitis B core IgM were negative. Hepatitis A and Hepatitis C were also negative. LFTs were within normal ranges.    No Known Allergies    Outpatient Medications Prior to Visit  Medication Sig Dispense Refill   Multiple Vitamins-Minerals (WOMENS MULTIVITAMIN PO) Take by mouth.     VITAMIN D PO Take by mouth.     No facility-administered medications prior to visit.     History reviewed. No pertinent past medical history.    Past Surgical History:  Procedure Laterality Date   CYSTECTOMY     OOPHORECTOMY        Family History  Problem Relation Age of Onset   Breast cancer Sister    Colon cancer Neg Hx    Esophageal cancer Neg Hx    Stomach cancer Neg Hx    Rectal cancer Neg Hx       Social History   Socioeconomic History   Marital status: Married    Spouse name: Not on file   Number of children: 2   Years of education: Not on file   Highest education level: Not on file  Occupational History   Occupation: Conservation officer, nature  Tobacco Use   Smoking status:  Never   Smokeless tobacco: Never  Vaping Use   Vaping Use: Never used  Substance and Sexual Activity   Alcohol use: Never   Drug use: Never   Sexual activity: Not on file  Other Topics Concern   Not on file  Social History Narrative   Not on file   Social Determinants of Health   Financial Resource Strain: Not on file  Food Insecurity: Not on file  Transportation Needs: Not on file  Physical Activity: Not on file  Stress: Not on file  Social Connections: Not on file  Intimate Partner Violence: Not on file      Review of Systems  Constitutional:  Negative for chills, fatigue, fever and unexpected weight change.  Respiratory:  Negative for cough, chest tightness, shortness of breath and wheezing.   Cardiovascular:  Negative for chest pain and leg swelling.  Gastrointestinal:  Negative for abdominal distention, constipation, diarrhea, nausea and vomiting.  Neurological:  Negative for dizziness, weakness, light-headedness and headaches.  Hematological:  Does not bruise/bleed easily.       Objective:    BP 102/63   Pulse 67   Temp 98 F (36.7 C) (Temporal)   Wt 115 lb (52.2 kg)   SpO2 97%   BMI 21.03 kg/m  Nursing note and vital signs reviewed.  Physical Exam Constitutional:      General: She is not in acute distress.    Appearance: She is well-developed.  Cardiovascular:     Rate and Rhythm: Normal rate and regular rhythm.     Heart sounds: Normal heart sounds. No murmur heard.    No friction rub. No gallop.  Pulmonary:     Effort: Pulmonary effort is normal. No respiratory distress.     Breath sounds: Normal breath sounds. No wheezing or rales.  Chest:     Chest wall: No tenderness.  Abdominal:     General: Bowel sounds are normal. There is no distension.     Palpations: Abdomen is soft. There is no mass.     Tenderness: There is no abdominal tenderness. There is no guarding or rebound.  Skin:    General: Skin is warm and dry.  Neurological:     Mental  Status: She is alert and oriented to person, place, and time.  Psychiatric:        Behavior: Behavior normal.        Thought Content: Thought content normal.        Judgment: Judgment normal.         Assessment & Plan:   Patient Active Problem List   Diagnosis Date Noted   Chronic viral hepatitis B without delta agent and without coma (HCC) 02/26/2022   EXTERNAL HEMORRHOIDS 05/01/2010   CONSTIPATION 05/01/2010     Problem List Items Addressed This Visit       Digestive   Chronic viral hepatitis B without delta agent and without coma (HCC) - Primary    [name] is a 51 y/o female with chronic Hepatitis B likely inactive with Hepatitis B DNA level of 10 and positive Core Ab and Surface antigen. LFTs within normal limits. Will obtain US for routine screening and fibrosis score to ensure no advanced fibrosis is present. Will also check Hepatitis D antibody. [name] has a high prevalence of Hepatitis B infection which is her largest risk factor. Discussed plan of care that no treatment is currently needed and that Hepatitis B is not curable. Will await remaining lab work and imaging for confirmation. Plan for follow up in 6 months or sooner if needed.       Relevant Orders   Liver Fibrosis, FibroTest-ActiTest   Hepatitis delta antibody   Hepatic function panel   US ABDOMEN COMPLETE W/ELASTOGRAPHY   Hepatitis B e antibody   Hepatitis B e antigen     I am having [name] maintain her VITAMIN D PO and Multiple Vitamins-Minerals (WOMENS MULTIVITAMIN PO).   Follow-up: Return in about 6 months (around 08/29/2022), or if symptoms worsen or fail to improve.    Marcos Eke, MSN, FNP-C Nurse Practitioner [facility] for Infectious Disease [facility] Main number: [phone]

## 2022-02-26 NOTE — Assessment & Plan Note (Signed)
Stephanie Murphy is a 51 y/o female with chronic Hepatitis B likely inactive with Hepatitis B DNA level of 10 and positive Core Ab and Surface antigen. LFTs within normal limits. Will obtain US for routine screening and fibrosis score to ensure no advanced fibrosis is present. Will also check Hepatitis D antibody. Stephanie Murphy has a high prevalence of Hepatitis B infection which is her largest risk factor. Discussed plan of care that no treatment is currently needed and that Hepatitis B is not curable. Will await remaining lab work and imaging for confirmation. Plan for follow up in 6 months or sooner if needed.

## 2022-02-27 LAB — HEPATIC FUNCTION PANEL
Alkaline phosphatase (APISO): 62 U/L (ref 37–153)
Total Protein: 6.7 g/dL (ref 6.1–8.1)

## 2022-03-03 LAB — LIVER FIBROSIS, FIBROTEST-ACTITEST
Alpha-2-Macroglobulin: 221 mg/dL (ref 106–279)
Apolipoprotein A1: 189 mg/dL (ref 101–198)
Fibrosis Score: 0.17
Haptoglobin: 65 mg/dL (ref 43–212)
Reference ID: 4457887

## 2022-03-04 LAB — HEPATIC FUNCTION PANEL
AG Ratio: 2.2 (calc) (ref 1.0–2.5)
ALT: 20 U/L (ref 6–29)
AST: 21 U/L (ref 10–35)
Albumin: 4.6 g/dL (ref 3.6–5.1)
Bilirubin, Direct: 0.1 mg/dL (ref 0.0–0.2)
Globulin: 2.1 g/dL (calc) (ref 1.9–3.7)
Indirect Bilirubin: 0.4 mg/dL (calc) (ref 0.2–1.2)
Total Bilirubin: 0.5 mg/dL (ref 0.2–1.2)

## 2022-03-04 LAB — LIVER FIBROSIS, FIBROTEST-ACTITEST
ALT: 23 U/L (ref 6–29)
Bilirubin: 0.6 mg/dL (ref 0.2–1.2)
GGT: 15 U/L (ref 3–70)
Necroinflammat ACT Score: 0.08

## 2022-03-04 LAB — HEPATITIS B E ANTIBODY: Hep B E Ab: REACTIVE — AB

## 2022-03-04 LAB — HEPATITIS B E ANTIGEN: Hep B E Ag: NONREACTIVE

## 2022-03-04 LAB — HEPATITIS DELTA ANTIBODY: Hepatitis D Ab, Total: NEGATIVE

## 2022-03-05 ENCOUNTER — Ambulatory Visit (HOSPITAL_COMMUNITY)
Admission: RE | Admit: 2022-03-05 | Discharge: 2022-03-05 | Disposition: A | Discharge status: Home / Self Care | Payer: 59 | Source: Ambulatory Visit | Attending: Family | Admitting: Family

## 2022-03-05 DIAGNOSIS — B181 Chronic viral hepatitis B without delta-agent: Secondary | ICD-10-CM | POA: Insufficient documentation

## 2022-06-16 ENCOUNTER — Ambulatory Visit (AMBULATORY_SURGERY_CENTER): Payer: Self-pay

## 2022-06-16 ENCOUNTER — Encounter: Payer: Self-pay | Admitting: Gastroenterology

## 2022-06-16 VITALS — Ht 62.0 in | Wt 116.0 lb

## 2022-06-16 DIAGNOSIS — Z86010 Personal history of colonic polyps: Secondary | ICD-10-CM

## 2022-06-16 MED ORDER — NA SULFATE-K SULFATE-MG SULF 17.5-3.13-1.6 GM/177ML PO SOLN: 1.0000 | Freq: Once | ORAL | 0 refills | Status: AC

## 2022-06-16 NOTE — Progress Notes (Signed)
Language interpreter present on video conference- along with care partner (son)  No egg or soy allergy known to patient  No issues known to pt with past sedation with any surgeries or procedures Patient denies ever being told they had issues or difficulty with intubation  No FH of Malignant Hyperthermia Pt is not on diet pills Pt is not on home 02  Pt is not on blood thinners  Pt denies issues with constipation;  No A fib or A flutter Have any cardiac testing pending--NO Pt instructed to use Singlecare.com or GoodRx for a price reduction on prep --GoodRx coupon given to patient during PV appt;  Insurance verified during [place] appt=[employer]  Patient's chart reviewed by Osvaldo Angst CNRA prior to previsit and patient appropriate for the [facility].  Previsit completed and red dot placed by patient's name on their procedure day (on provider's schedule).   Patient accompanied by female care partner (son)

## 2022-06-19 ENCOUNTER — Encounter: Payer: Self-pay | Admitting: Gastroenterology

## 2022-06-19 ENCOUNTER — Ambulatory Visit (AMBULATORY_SURGERY_CENTER): Payer: Commercial Managed Care - HMO | Admitting: Gastroenterology

## 2022-06-19 VITALS — BP 103/65 | HR 53 | Temp 97.8°F | Resp 13 | Ht 62.0 in | Wt 116.0 lb

## 2022-06-19 DIAGNOSIS — Z86010 Personal history of colonic polyps: Secondary | ICD-10-CM | POA: Diagnosis not present

## 2022-06-19 DIAGNOSIS — Z09 Encounter for follow-up examination after completed treatment for conditions other than malignant neoplasm: Secondary | ICD-10-CM

## 2022-06-19 MED ORDER — SODIUM CHLORIDE 0.9 % IV SOLN: 500.0000 mL | Freq: Once | INTRAVENOUS | Status: DC

## 2022-06-19 NOTE — Patient Instructions (Signed)
YOU HAD AN ENDOSCOPIC PROCEDURE TODAY AT THE [facility]:   Refer to the procedure report that was given to you for any specific questions about what was found during the examination.  If the procedure report does not answer your questions, please call your gastroenterologist to clarify.  If you requested that your care partner not be given the details of your procedure findings, then the procedure report has been included in a sealed envelope for you to review at your convenience later.  YOU SHOULD EXPECT: Some feelings of bloating in the abdomen. Passage of more gas than usual.  Walking can help get rid of the air that was put into your GI tract during the procedure and reduce the bloating. If you had a lower endoscopy (such as a colonoscopy or flexible sigmoidoscopy) you may notice spotting of blood in your stool or on the toilet paper. If you underwent a bowel prep for your procedure, you may not have a normal bowel movement for a few days.  Please Note:  You might notice some irritation and congestion in your nose or some drainage.  This is from the oxygen used during your procedure.  There is no need for concern and it should clear up in a day or so.  SYMPTOMS TO REPORT IMMEDIATELY:  Following lower endoscopy (colonoscopy or flexible sigmoidoscopy):  Excessive amounts of blood in the stool  Significant tenderness or worsening of abdominal pains  Swelling of the abdomen that is new, acute  Fever of 100F or higher  For urgent or emergent issues, a gastroenterologist can be reached at any hour by calling [phone]. Do not use MyChart messaging for urgent concerns.    DIET:  We do recommend a small meal at first, but then you may proceed to your regular diet.  Drink plenty of fluids but you should avoid alcoholic beverages for 24 hours.  ACTIVITY:  You should plan to take it easy for the rest of today and you should NOT DRIVE or use heavy machinery until tomorrow (because of  the sedation medicines used during the test).    FOLLOW UP: Our staff will call the number listed on your records the next business day following your procedure.  We will call around 7:15- 8:00 am to check on you and address any questions or concerns that you may have regarding the information given to you following your procedure. If we do not reach you, we will leave a message.      SIGNATURES/CONFIDENTIALITY: You and/or your care partner have signed paperwork which will be entered into your electronic medical record.  These signatures attest to the fact that that the information above on your After Visit Summary has been reviewed and is understood.  Full responsibility of the confidentiality of this discharge information lies with you and/or your care-partner. 

## 2022-06-19 NOTE — Progress Notes (Signed)
History and Physical:  This patient presents for endoscopic testing for: Encounter Diagnosis  Name Primary?   Personal history of colonic polyps Yes    <5mm A C TA polyp at outside clinic May 2016 Seen with videophone interpreter (Mandarin) Patient denies chronic abdominal pain, rectal bleeding, constipation or diarrhea.   Patient is otherwise without complaints or active issues today.   Past Medical History: Past Medical History:  Diagnosis Date   Arthritis    LEFT knee     Past Surgical History: Past Surgical History:  Procedure Laterality Date   COLONOSCOPY  2016   SSP   CYSTECTOMY     DENTAL SURGERY     crowns on upper and lower front teeth   OOPHORECTOMY  2006   laparascopic-patient does not remember which side was removed - only one side ; fibroids removed off of opposite side   POLYPECTOMY  2016   SSP   WISDOM TOOTH EXTRACTION     all 4 removed    Allergies: No Known Allergies  Outpatient Meds: Current Outpatient Medications  Medication Sig Dispense Refill   Multiple Vitamins-Minerals (WOMENS MULTIVITAMIN PO) Take 1 tablet by mouth daily at 6 (six) AM.     Current Facility-Administered Medications  Medication Dose Route Frequency Provider Last Rate Last Admin   0.9 %  sodium chloride infusion  500 mL Intravenous Once Nelida Meuse III, MD          ___________________________________________________________________ Objective   Exam:  BP 103/70   Pulse 61   Temp 97.8 F (36.6 C) (Skin)   Ht 5\' 2"  (1.575 m)   Wt 116 lb (52.6 kg)   PF 99 L/min   BMI 21.22 kg/m   CV: regular , S1/S2 Resp: clear to auscultation bilaterally, normal RR and effort noted GI: soft, no tenderness, with active bowel sounds.   Assessment: Encounter Diagnosis  Name Primary?   Personal history of colonic polyps Yes     Plan: Colonoscopy  The benefits and risks of the planned procedure were described in detail with the patient or (when appropriate) their  health care proxy.  Risks were outlined as including, but not limited to, bleeding, infection, perforation, adverse medication reaction leading to cardiac or pulmonary decompensation, pancreatitis (if ERCP).  The limitation of incomplete mucosal visualization was also discussed.  No guarantees or warranties were given.    The patient is appropriate for an endoscopic procedure in the ambulatory setting.   - Wilfrid Lund, MD

## 2022-06-19 NOTE — Op Note (Signed)
[facility] Patient Name: Stephanie Murphy Procedure Date: 06/19/2022 3:06 PM MRN: [id number] Endoscopist: Sherilyn Cooter L. Myrtie Neither , MD, [id number] Age: 51 Referring MD:  Date of Birth: [birth year] Gender: Female Account #: [id number] Procedure:                Colonoscopy Indications:              Surveillance: Personal history of adenomatous                            polyps on last colonoscopy > 5 years ago                           TA < 38mm last colonoscopy May 2016 (outside clinic) Medicines:                Monitored Anesthesia Care Procedure:                Pre-Anesthesia Assessment:                           - Prior to the procedure, a History and Physical                            was performed, and patient medications and                            allergies were reviewed. The patient's tolerance of                            previous anesthesia was also reviewed. The risks                            and benefits of the procedure and the sedation                            options and risks were discussed with the patient.                            All questions were answered, and informed consent                            was obtained. Prior Anticoagulants: The patient has                            taken no anticoagulant or antiplatelet agents. ASA                            Grade Assessment: I - A normal, healthy patient.                            After reviewing the risks and benefits, the patient                            was deemed in satisfactory condition to undergo the  procedure.                           After obtaining informed consent, the colonoscope                            was passed under direct vision. Throughout the                            procedure, the patient's blood pressure, pulse, and                            oxygen saturations were monitored continuously. The                            PCF-HQ190L Colonoscope was  introduced through the                            anus and advanced to the the cecum, identified by                            appendiceal orifice and ileocecal valve. The                            colonoscopy was performed without difficulty. The                            patient tolerated the procedure well. The quality                            of the bowel preparation was excellent. The                            ileocecal valve, appendiceal orifice, and rectum                            were photographed. The bowel preparation used was                            SUPREP. Scope In: 3:25:44 PM Scope Out: 3:37:46 PM Scope Withdrawal Time: 0 hours 8 minutes 30 seconds  Total Procedure Duration: 0 hours 12 minutes 2 seconds  Findings:                 The perianal and digital rectal examinations were                            normal.                           Repeat examination of right colon under NBI                            performed.  The entire examined colon appeared normal on direct                            and retroflexion views. Complications:            No immediate complications. Estimated Blood Loss:     Estimated blood loss: none. Impression:               - The entire examined colon is normal on direct and                            retroflexion views.                           - No specimens collected. Recommendation:           - Patient has a contact number available for                            emergencies. The signs and symptoms of potential                            delayed complications were discussed with the                            patient. Return to normal activities tomorrow.                            Written discharge instructions were provided to the                            patient.                           - Resume previous diet.                           - Continue present medications.                           -  Repeat colonoscopy in 10 years for surveillance. Tildon Silveria L. Myrtie Neither, MD 06/19/2022 3:43:54 PM This report has been signed electronically.

## 2022-06-19 NOTE — Progress Notes (Signed)
Interpreter- Ipad used with interpreter line- Tasha  Pt's states no medical or surgical changes since previsit or office visit.

## 2022-06-19 NOTE — Progress Notes (Signed)
Sedate, gd SR, tolerated procedure well, VSS, report to RN 

## 2022-06-22 ENCOUNTER — Telehealth: Payer: Self-pay | Admitting: *Deleted

## 2022-06-22 NOTE — Telephone Encounter (Signed)
Post procedure follow up call placed, no answer and No VM.

## 2022-07-02 ENCOUNTER — Encounter: Payer: Self-pay | Admitting: Gastroenterology

## 2022-08-20 ENCOUNTER — Ambulatory Visit: Payer: 59 | Admitting: Family

## 2022-09-03 DIAGNOSIS — Z23 Encounter for immunization: Secondary | ICD-10-CM | POA: Diagnosis not present

## 2022-09-29 ENCOUNTER — Other Ambulatory Visit: Payer: Self-pay | Admitting: Family Medicine

## 2022-09-29 ENCOUNTER — Ambulatory Visit
Admission: RE | Admit: 2022-09-29 | Discharge: 2022-09-29 | Disposition: A | Discharge status: Home / Self Care | Payer: 59 | Source: Ambulatory Visit | Attending: Family Medicine | Admitting: Family Medicine

## 2022-09-29 DIAGNOSIS — M533 Sacrococcygeal disorders, not elsewhere classified: Secondary | ICD-10-CM

## 2022-10-08 DIAGNOSIS — R102 Pelvic and perineal pain: Secondary | ICD-10-CM | POA: Diagnosis not present

## 2022-10-13 ENCOUNTER — Other Ambulatory Visit: Payer: Self-pay | Admitting: Obstetrics

## 2022-10-13 DIAGNOSIS — R102 Pelvic and perineal pain: Secondary | ICD-10-CM

## 2022-11-30 DIAGNOSIS — R102 Pelvic and perineal pain: Secondary | ICD-10-CM | POA: Diagnosis not present

## 2022-11-30 DIAGNOSIS — Z30432 Encounter for removal of intrauterine contraceptive device: Secondary | ICD-10-CM | POA: Diagnosis not present

## 2023-01-14 DIAGNOSIS — J069 Acute upper respiratory infection, unspecified: Secondary | ICD-10-CM | POA: Diagnosis not present

## 2023-01-14 DIAGNOSIS — R059 Cough, unspecified: Secondary | ICD-10-CM | POA: Diagnosis not present

## 2023-01-25 DIAGNOSIS — Z23 Encounter for immunization: Secondary | ICD-10-CM | POA: Diagnosis not present

## 2023-01-25 DIAGNOSIS — Z13220 Encounter for screening for lipoid disorders: Secondary | ICD-10-CM | POA: Diagnosis not present

## 2023-01-25 DIAGNOSIS — K30 Functional dyspepsia: Secondary | ICD-10-CM | POA: Diagnosis not present

## 2023-01-25 DIAGNOSIS — K589 Irritable bowel syndrome without diarrhea: Secondary | ICD-10-CM | POA: Diagnosis not present

## 2023-01-25 DIAGNOSIS — B1910 Unspecified viral hepatitis B without hepatic coma: Secondary | ICD-10-CM | POA: Diagnosis not present

## 2023-01-25 DIAGNOSIS — K219 Gastro-esophageal reflux disease without esophagitis: Secondary | ICD-10-CM | POA: Diagnosis not present

## 2023-01-25 DIAGNOSIS — Z0000 Encounter for general adult medical examination without abnormal findings: Secondary | ICD-10-CM | POA: Diagnosis not present

## 2023-01-25 DIAGNOSIS — Z8619 Personal history of other infectious and parasitic diseases: Secondary | ICD-10-CM | POA: Diagnosis not present

## 2023-01-27 ENCOUNTER — Other Ambulatory Visit: Payer: Self-pay

## 2023-01-27 ENCOUNTER — Encounter: Payer: Self-pay | Admitting: Family

## 2023-01-27 ENCOUNTER — Ambulatory Visit (INDEPENDENT_AMBULATORY_CARE_PROVIDER_SITE_OTHER): Payer: 59 | Admitting: Family

## 2023-01-27 VITALS — BP 97/65 | HR 91 | Temp 97.6°F | Wt 115.0 lb

## 2023-01-27 DIAGNOSIS — B181 Chronic viral hepatitis B without delta-agent: Secondary | ICD-10-CM | POA: Diagnosis not present

## 2023-01-27 NOTE — Assessment & Plan Note (Addendum)
Ms. Bally has chronic inactive Hepatitis B and does not require treatment at this time. No signs of flare or reactivation. Discussed the basics of Hepatitis B including transmission, potential complications, lab monitoring, and plan of care. Check blood work and ultrasound. Continue to monitor with plan to follow up in 6 months or sooner if needed.

## 2023-01-27 NOTE — Progress Notes (Signed)
Subjective:    Patient ID: Stephanie Murphy, female    DOB: 03-08-1971, 52 y.o.   MRN: [id number]  Chief Complaint  Patient presents with   Follow-up    Hep B   Hepatitis B    HPI:  Stephanie Murphy is a 52 y.o. female with chronic Hepatitis B last seen on 02/26/22 with inactive chronic Hepatitis B that not on treatment with DNA level of 10 and normal LFTs. Elastography with F0. Here today for follow up. Stephanie Murphy primary preferred language is [place] Air traffic controller) and a medical interpreter is present via tablet to aid in communication.  Stephanie Murphy has been doing well since her last office visit and has no new concerns/complaints. No signs/symptoms of Hepatitis B flare or reactivation and denies abdominal pain, nausea, vomiting, fatigue, fever, scleral icterus or jaundice. Has questions about her Hepatitis B being cured.   No Known Allergies    Outpatient Medications Prior to Visit  Medication Sig Dispense Refill   Multiple Vitamins-Minerals (WOMENS MULTIVITAMIN PO) Take 1 tablet by mouth daily at 6 (six) AM. (Patient not taking: Reported on 01/27/2023)     No facility-administered medications prior to visit.     Past Medical History:  Diagnosis Date   Arthritis    LEFT knee     Past Surgical History:  Procedure Laterality Date   COLONOSCOPY  2016   SSP   CYSTECTOMY     DENTAL SURGERY     crowns on upper and lower front teeth   OOPHORECTOMY  2006   laparascopic-patient does not remember which side was removed - only one side ; fibroids removed off of opposite side   POLYPECTOMY  2016   SSP   WISDOM TOOTH EXTRACTION     all 4 removed       Review of Systems  Constitutional:  Negative for chills, fatigue, fever and unexpected weight change.  Respiratory:  Negative for cough, chest tightness, shortness of breath and wheezing.   Cardiovascular:  Negative for chest pain and leg swelling.  Gastrointestinal:  Negative for abdominal distention, constipation, diarrhea, nausea and  vomiting.  Neurological:  Negative for dizziness, weakness, light-headedness and headaches.  Hematological:  Does not bruise/bleed easily.      Objective:    BP 97/65   Pulse 91   Temp 97.6 F (36.4 C) (Oral)   Wt 115 lb (52.2 kg)   SpO2 97%   BMI 21.03 kg/m  Nursing note and vital signs reviewed.  Physical Exam Constitutional:      General: She is not in acute distress.    Appearance: She is well-developed.  Cardiovascular:     Rate and Rhythm: Normal rate and regular rhythm.     Heart sounds: Normal heart sounds. No murmur heard.    No friction rub. No gallop.  Pulmonary:     Effort: Pulmonary effort is normal. No respiratory distress.     Breath sounds: Normal breath sounds. No wheezing or rales.  Chest:     Chest wall: No tenderness.  Abdominal:     General: Bowel sounds are normal. There is no distension.     Palpations: Abdomen is soft. There is no mass.     Tenderness: There is no abdominal tenderness. There is no guarding or rebound.  Skin:    General: Skin is warm and dry.  Neurological:     Mental Status: She is alert and oriented to person, place, and time.  Psychiatric:  Behavior: Behavior normal.        Thought Content: Thought content normal.        Judgment: Judgment normal.          No data to display             Assessment & Plan:    Patient Active Problem List   Diagnosis Date Noted   Chronic viral hepatitis B without delta agent and without coma (HCC) 02/26/2022   EXTERNAL HEMORRHOIDS 05/01/2010   CONSTIPATION 05/01/2010     Problem List Items Addressed This Visit       Digestive   Chronic viral hepatitis B without delta agent and without coma (HCC) - Primary    Stephanie Murphy has chronic inactive Hepatitis B and does not require treatment at this time. No signs of flare or reactivation. Discussed the basics of Hepatitis B including transmission, potential complications, lab monitoring, and plan of care. Check blood work and  ultrasound. Continue to monitor with plan to follow up in 6 months or sooner if needed.        Relevant Orders   Hepatitis B DNA, ultraquantitative, PCR   Hepatitis B surface antibody,qualitative (Completed)   Hepatitis B e antigen   US ABDOMEN COMPLETE W/ELASTOGRAPHY   Hepatic function panel   CBC (Completed)   Protime-INR (Completed)   Liver Fibrosis, FibroTest-ActiTest     I am having Stephanie Murphy maintain her Multiple Vitamins-Minerals (WOMENS MULTIVITAMIN PO).   Follow-up: Return in about 6 months (around 07/29/2023), or if symptoms worsen or fail to improve.   Marcos Eke, MSN, FNP-C Nurse Practitioner [facility] for Infectious Disease [facility] Main number: [phone]

## 2023-01-27 NOTE — Patient Instructions (Addendum)
Nice to see you.  We will check your lab work today.  Plan for follow up in 6 months or sooner if needed with lab work on the same day.  Have a great day and stay safe!  

## 2023-02-02 ENCOUNTER — Telehealth: Payer: Self-pay

## 2023-02-02 NOTE — Telephone Encounter (Signed)
-----   Message from Veryl Speak, FNP sent at 02/02/2023  1:10 PM EDT ----- Please inform Ms. Hotz that her lab looks good and no treatment is needed and will continue to monitor. Thanks!

## 2023-02-02 NOTE — Telephone Encounter (Signed)
I attempted to reach the patient to relay lab results. Patient did not answer and no VM setup. Stephanie Murphy T [name]

## 2023-02-03 NOTE — Telephone Encounter (Signed)
Second attempt to reach patient, no answer. Interpreter left voicemail requesting call back.   [employer] [phone]  Sandie Ano, RN

## 2023-02-04 ENCOUNTER — Ambulatory Visit (HOSPITAL_COMMUNITY)
Admission: RE | Admit: 2023-02-04 | Discharge: 2023-02-04 | Disposition: A | Discharge status: Home / Self Care | Payer: 59 | Source: Ambulatory Visit | Attending: Family | Admitting: Family

## 2023-02-04 DIAGNOSIS — B1910 Unspecified viral hepatitis B without hepatic coma: Secondary | ICD-10-CM | POA: Diagnosis not present

## 2023-02-04 DIAGNOSIS — B181 Chronic viral hepatitis B without delta-agent: Secondary | ICD-10-CM | POA: Insufficient documentation

## 2023-02-04 NOTE — Telephone Encounter (Signed)
I spoke to patient's spouse Orson Slick and relayed lab results and he verbalized understanding.  Donivan Thammavong T [name]

## 2023-02-10 LAB — HEPATIC FUNCTION PANEL
AG Ratio: 2.2 (calc) (ref 1.0–2.5)
ALT: 17 U/L (ref 6–29)
AST: 18 U/L (ref 10–35)
Albumin: 4.4 g/dL (ref 3.6–5.1)
Alkaline phosphatase (APISO): 64 U/L (ref 37–153)
Bilirubin, Direct: 0.1 mg/dL (ref 0.0–0.2)
Globulin: 2 g/dL (calc) (ref 1.9–3.7)
Indirect Bilirubin: 0.5 mg/dL (calc) (ref 0.2–1.2)
Total Bilirubin: 0.6 mg/dL (ref 0.2–1.2)
Total Protein: 6.4 g/dL (ref 6.1–8.1)

## 2023-02-10 LAB — LIVER FIBROSIS, FIBROTEST-ACTITEST
ALT: 17 U/L (ref 6–29)
Alpha-2-Macroglobulin: 211 mg/dL (ref 106–279)
Apolipoprotein A1: 192 mg/dL (ref 101–198)
Bilirubin: 0.6 mg/dL (ref 0.2–1.2)
Fibrosis Score: 0.16
GGT: 17 U/L (ref 3–70)
Haptoglobin: 68 mg/dL (ref 43–212)
Necroinflammat ACT Score: 0.05
Reference ID: 4979402

## 2023-02-10 LAB — HEPATITIS B DNA, ULTRAQUANTITATIVE, PCR
Hepatitis B DNA: NOT DETECTED IU/mL
Hepatitis B virus DNA: NOT DETECTED Log IU/mL

## 2023-02-10 LAB — CBC
HCT: 38.5 % (ref 35.0–45.0)
Hemoglobin: 12.8 g/dL (ref 11.7–15.5)
MCH: 30.6 pg (ref 27.0–33.0)
MCHC: 33.2 g/dL (ref 32.0–36.0)
MCV: 92.1 fL (ref 80.0–100.0)
MPV: 10.7 fL (ref 7.5–12.5)
Platelets: 150 10*3/uL (ref 140–400)
RBC: 4.18 10*6/uL (ref 3.80–5.10)
RDW: 12.3 % (ref 11.0–15.0)
WBC: 5.8 10*3/uL (ref 3.8–10.8)

## 2023-02-10 LAB — HEPATITIS B E ANTIGEN: Hep B E Ag: NONREACTIVE

## 2023-02-10 LAB — HEPATITIS B SURFACE ANTIBODY,QUALITATIVE: Hep B S Ab: NONREACTIVE

## 2023-02-10 LAB — PROTIME-INR
INR: 1
Prothrombin Time: 10.4 s (ref 9.0–11.5)

## 2023-08-19 ENCOUNTER — Ambulatory Visit: Payer: 59 | Admitting: Family

## 2024-07-12 ENCOUNTER — Other Ambulatory Visit (HOSPITAL_BASED_OUTPATIENT_CLINIC_OR_DEPARTMENT_OTHER): Payer: Self-pay | Admitting: Nurse Practitioner

## 2024-07-12 DIAGNOSIS — Z780 Asymptomatic menopausal state: Secondary | ICD-10-CM

## 2024-07-12 DIAGNOSIS — Z1231 Encounter for screening mammogram for malignant neoplasm of breast: Secondary | ICD-10-CM
